# Patient Record
Sex: Male | Born: 1975 | Race: White | Hispanic: No | Marital: Married
Health system: Southern US, Community
[De-identification: ages and names within clinical notes are randomized; demographics above are authoritative.]

---

## 2000-03-05 ENCOUNTER — Emergency Department (HOSPITAL_COMMUNITY): Admission: EM | Admit: 2000-03-05 | Discharge: 2000-03-05 | Payer: Self-pay | Admitting: Emergency Medicine

## 2010-06-21 ENCOUNTER — Other Ambulatory Visit: Payer: Self-pay | Admitting: Pulmonary Disease

## 2010-06-21 ENCOUNTER — Ambulatory Visit
Admission: RE | Admit: 2010-06-21 | Discharge: 2010-06-21 | Disposition: A | Discharge status: Home / Self Care | Payer: Managed Care, Other (non HMO) | Source: Ambulatory Visit | Attending: Pulmonary Disease | Admitting: Pulmonary Disease

## 2010-06-21 DIAGNOSIS — R9389 Abnormal findings on diagnostic imaging of other specified body structures: Secondary | ICD-10-CM

## 2010-06-21 DIAGNOSIS — J189 Pneumonia, unspecified organism: Secondary | ICD-10-CM

## 2010-06-21 MED ORDER — IOHEXOL 300 MG/ML  SOLN
75.0000 mL | Freq: Once | INTRAMUSCULAR | Status: AC | PRN
  Administered 2010-06-21: 75 mL via INTRAVENOUS

## 2010-08-22 ENCOUNTER — Telehealth: Payer: Self-pay | Admitting: *Deleted

## 2010-08-22 DIAGNOSIS — E785 Hyperlipidemia, unspecified: Secondary | ICD-10-CM

## 2010-08-22 NOTE — Telephone Encounter (Signed)
Per Dr Gala Romney pt needs fasting labs, cmet, lipids, cbc, crp, hgb a1c he will come in on Fri 8/10

## 2010-08-24 ENCOUNTER — Other Ambulatory Visit (INDEPENDENT_AMBULATORY_CARE_PROVIDER_SITE_OTHER): Payer: Managed Care, Other (non HMO) | Admitting: *Deleted

## 2010-08-24 DIAGNOSIS — E785 Hyperlipidemia, unspecified: Secondary | ICD-10-CM

## 2010-08-24 LAB — CBC WITH DIFFERENTIAL/PLATELET
Basophils Relative: 0.5 % (ref 0.0–3.0)
Eosinophils Relative: 1.3 % (ref 0.0–5.0)
Lymphocytes Relative: 35.5 % (ref 12.0–46.0)
MCV: 94.2 fl (ref 78.0–100.0)
Monocytes Relative: 7.7 % (ref 3.0–12.0)
Neutrophils Relative %: 55 % (ref 43.0–77.0)
RBC: 4.8 Mil/uL (ref 4.22–5.81)
WBC: 4.7 10*3/uL (ref 4.5–10.5)

## 2010-08-24 LAB — HEPATIC FUNCTION PANEL
ALT: 23 U/L (ref 0–53)
AST: 24 U/L (ref 0–37)
Alkaline Phosphatase: 114 U/L (ref 39–117)
Bilirubin, Direct: 0 mg/dL (ref 0.0–0.3)
Total Protein: 8.2 g/dL (ref 6.0–8.3)

## 2010-08-24 LAB — BASIC METABOLIC PANEL
BUN: 15 mg/dL (ref 6–23)
CO2: 30 mEq/L (ref 19–32)
GFR: 94.93 mL/min (ref >60.00)
Glucose, Bld: 89 mg/dL (ref 70–99)
Potassium: 4.5 mEq/L (ref 3.5–5.1)

## 2010-08-24 LAB — HEMOGLOBIN A1C: Hgb A1c MFr Bld: 5.7 % (ref 4.6–6.5)

## 2010-08-24 LAB — LIPID PANEL
LDL Cholesterol: 138 mg/dL — ABNORMAL HIGH (ref 0–99)
Total CHOL/HDL Ratio: 5

## 2010-09-19 ENCOUNTER — Telehealth (HOSPITAL_COMMUNITY): Payer: Self-pay | Admitting: *Deleted

## 2010-09-19 DIAGNOSIS — E785 Hyperlipidemia, unspecified: Secondary | ICD-10-CM

## 2010-09-19 NOTE — Telephone Encounter (Signed)
gxt ordered 

## 2010-09-19 NOTE — Telephone Encounter (Signed)
Message copied by Noralee Space on Wed Sep 19, 2010 10:11 AM ------      Message from: Arvilla Meres R      Created: Mon Aug 27, 2010  9:37 PM       Cholesterol and CRP high. I emailed him and told him he needs diet and exercise. Please arrange for ETT .

## 2010-10-09 ENCOUNTER — Encounter: Payer: Managed Care, Other (non HMO) | Admitting: Internal Medicine

## 2010-10-23 ENCOUNTER — Ambulatory Visit (INDEPENDENT_AMBULATORY_CARE_PROVIDER_SITE_OTHER): Payer: Managed Care, Other (non HMO) | Admitting: Internal Medicine

## 2010-10-23 DIAGNOSIS — E785 Hyperlipidemia, unspecified: Secondary | ICD-10-CM

## 2010-10-23 NOTE — Progress Notes (Signed)
Exercise Treadmill Test  Pre-Exercise Testing Evaluation Rhythm: normal sinus  Rate: 77   PR:  15 QRS:  .09  QT:  .38 QTc: .43     Test  Exercise Tolerance Test Ordering MD: Arvilla Meres, MD  Interpreting MD:  Arvilla Meres, MD  Unique Test No: 1  Treadmill:  1  Indication for ETT: known ASHD  Contraindication to ETT: No   Stress Modality: exercise - treadmill  Cardiac Imaging Performed: non   Protocol: standard Bruce - maximal  Max BP:  203/52  Max MPHR (bpm):  188 85% MPR (bpm):  159   MPHR obtained (bpm):  203 Percentage if MPHR obtained: 105%  Reached 85% MPHR (min:sec):  3:00 Total exercise time: 8:00  Workload in METS:  11.7 Borg Scale: 18  Reason ETT Terminated:  fatigue    ST Segment Analysis At Rest: normal ST segments - no evidence of significant ST depression With Exercise: no evidence of significant ST depression  Other Information Arrhythmia:  No Angina during ETT:  absent (0) Quality of ETT:  diagnostic  ETT Interpretation:  normal - no evidence of ischemia by ST analysis  Comments: Stage I advanced after 1:00 so total exercise time 10:00. Hypertensive response to exercise. Brisk HR response.  Recommendations: Regular exercise training. Continue risk factor management.

## 2013-06-24 ENCOUNTER — Other Ambulatory Visit: Payer: Self-pay | Admitting: Otolaryngology

## 2013-06-24 DIAGNOSIS — R1314 Dysphagia, pharyngoesophageal phase: Secondary | ICD-10-CM

## 2013-06-28 ENCOUNTER — Ambulatory Visit
Admission: RE | Admit: 2013-06-28 | Discharge: 2013-06-28 | Disposition: A | Discharge status: Home / Self Care | Payer: Managed Care, Other (non HMO) | Source: Ambulatory Visit | Attending: Otolaryngology | Admitting: Otolaryngology

## 2013-06-28 DIAGNOSIS — R1314 Dysphagia, pharyngoesophageal phase: Secondary | ICD-10-CM

## 2013-12-01 ENCOUNTER — Encounter: Payer: Self-pay | Admitting: Internal Medicine

## 2019-01-20 ENCOUNTER — Ambulatory Visit: Payer: Managed Care, Other (non HMO) | Attending: Internal Medicine

## 2019-01-20 DIAGNOSIS — Z20822 Contact with and (suspected) exposure to covid-19: Secondary | ICD-10-CM

## 2019-01-22 LAB — NOVEL CORONAVIRUS, NAA: SARS-CoV-2, NAA: NOT DETECTED

## 2019-05-10 ENCOUNTER — Ambulatory Visit (HOSPITAL_COMMUNITY)
Admission: RE | Admit: 2019-05-10 | Discharge: 2019-05-10 | Disposition: A | Discharge status: Home / Self Care | Payer: BC Managed Care – PPO | Source: Ambulatory Visit | Attending: Internal Medicine | Admitting: Internal Medicine

## 2019-05-10 ENCOUNTER — Ambulatory Visit (HOSPITAL_BASED_OUTPATIENT_CLINIC_OR_DEPARTMENT_OTHER)
Admission: RE | Admit: 2019-05-10 | Discharge: 2019-05-10 | Disposition: A | Discharge status: Home / Self Care | Payer: BC Managed Care – PPO | Source: Ambulatory Visit | Attending: Internal Medicine | Admitting: Internal Medicine

## 2019-05-10 ENCOUNTER — Other Ambulatory Visit: Payer: Self-pay

## 2019-05-10 ENCOUNTER — Telehealth (HOSPITAL_COMMUNITY): Payer: Self-pay | Admitting: *Deleted

## 2019-05-10 VITALS — BP 148/98 | HR 60 | Ht 76.0 in | Wt 243.1 lb

## 2019-05-10 DIAGNOSIS — I479 Paroxysmal tachycardia, unspecified: Secondary | ICD-10-CM

## 2019-05-10 DIAGNOSIS — R0600 Dyspnea, unspecified: Secondary | ICD-10-CM

## 2019-05-10 DIAGNOSIS — R5383 Other fatigue: Secondary | ICD-10-CM | POA: Diagnosis not present

## 2019-05-10 DIAGNOSIS — Z8616 Personal history of COVID-19: Secondary | ICD-10-CM | POA: Diagnosis not present

## 2019-05-10 DIAGNOSIS — E782 Mixed hyperlipidemia: Secondary | ICD-10-CM | POA: Insufficient documentation

## 2019-05-10 DIAGNOSIS — R000 Tachycardia, unspecified: Secondary | ICD-10-CM | POA: Diagnosis not present

## 2019-05-10 LAB — COMPREHENSIVE METABOLIC PANEL
ALT: 23 U/L (ref 0–44)
AST: 24 U/L (ref 15–41)
Albumin: 4.4 g/dL (ref 3.5–5.0)
Alkaline Phosphatase: 148 U/L — ABNORMAL HIGH (ref 38–126)
Anion gap: 11 (ref 5–15)
BUN: 17 mg/dL (ref 6–20)
CO2: 22 mmol/L (ref 22–32)
Calcium: 9.4 mg/dL (ref 8.9–10.3)
Chloride: 105 mmol/L (ref 98–111)
Creatinine, Ser: 0.96 mg/dL (ref 0.61–1.24)
GFR calc Af Amer: >60 mL/min (ref >60)
GFR calc non Af Amer: >60 mL/min (ref >60)
Glucose, Bld: 79 mg/dL (ref 70–99)
Potassium: 4.3 mmol/L (ref 3.5–5.1)
Sodium: 138 mmol/L (ref 135–145)
Total Bilirubin: 0.8 mg/dL (ref 0.3–1.2)
Total Protein: 7.8 g/dL (ref 6.5–8.1)

## 2019-05-10 LAB — CBC
HCT: 44.3 % (ref 39.0–52.0)
Hemoglobin: 14.7 g/dL (ref 13.0–17.0)
MCH: 31.4 pg (ref 26.0–34.0)
MCHC: 33.2 g/dL (ref 30.0–36.0)
MCV: 94.7 fL (ref 80.0–100.0)
Platelets: 212 10*3/uL (ref 150–400)
RBC: 4.68 MIL/uL (ref 4.22–5.81)
RDW: 12.5 % (ref 11.5–15.5)
WBC: 5.8 10*3/uL (ref 4.0–10.5)
nRBC: 0 % (ref 0.0–0.2)

## 2019-05-10 LAB — LIPID PANEL
Cholesterol: 348 mg/dL — ABNORMAL HIGH (ref 0–200)
HDL: 33 mg/dL — ABNORMAL LOW (ref >40)
LDL Cholesterol: 300 mg/dL — ABNORMAL HIGH (ref 0–99)
Total CHOL/HDL Ratio: 10.5 RATIO
Triglycerides: 77 mg/dL (ref <150)
VLDL: 15 mg/dL (ref 0–40)

## 2019-05-10 LAB — TSH: TSH: 1.386 u[IU]/mL (ref 0.350–4.500)

## 2019-05-10 NOTE — Progress Notes (Signed)
CARDIOLOGY [facility] CLINIC  NOTE   HPI:  Edward Bauer is a 44 y/o male  With no significant PMHx. Biological father died at 49 from an "arrhythmia".  We did stress test on him in 2012 and was normal.  Had Covid in 12/20. Very fatigued, unable to climb stairs. 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  Started Keto diet in January. Has lost 55 pounds. Very low carbs. Ad 1st covid vaccine last week   Doing resistance bands 6 nights per week. HR gets up in 140-150s. No change. Went hiking yesterday at [employer]. Felt great at the beginning. Walking up the hill was very SOB and HR was in 180s on Apple Watch. Walk down was fine. No CP.   Echo today EF 60-65% normal RV. Normal diastolic function.  NSR 68 No ST-T wave abnormalities.   Mild flutter occasionally for 5-10 secs then goes away. No caffeine. + snoring     Review of Systems: [y] = yes, [ ]  = no   General: Weight gain [ ] ; Weight loss ]; Anorexia [ ] ; Fatigue ]; Fever [ ] ; Chills [ ] ; Weakness [ ]   Cardiac: Chest pain/pressure [ ] ; Resting SOB [ ] ; Exertional SOB [place] ]; Orthopnea [ ] ; Pedal Edema [ ] ; Palpitations ]; Syncope [ ] ; Presyncope [ ] ; Paroxysmal nocturnal dyspnea[ ]   Pulmonary: Cough [ ] ; Wheezing[ ] ; Hemoptysis[ ] ; Sputum [ ] ; Snoring [ ]   GI: Vomiting[ ] ; Dysphagia[ ] ; Melena[ ] ; Hematochezia [ ] ; Heartburn[ ] ; Abdominal pain [ ] ; Constipation [ ] ; Diarrhea [ ] ; BRBPR [ ]   GU: Hematuria[ ] ; Dysuria [ ] ; Nocturia[ ]   Vascular: Pain in legs with walking [ ] ; Pain in feet with lying flat [ ] ; Non-healing sores [ ] ; Stroke [ ] ; TIA [ ] ; Slurred speech [ ] ;  Neuro: Headaches[ ] ; Vertigo[ ] ; Seizures[ ] ; Paresthesias[ ] ;Blurred vision [ ] ; Diplopia [ ] ; Vision changes [ ]   Ortho/Skin: Arthritis [ ] ; Joint pain [ ] ; Muscle pain [ ] ; Joint swelling [ ] ; Back Pain [ ] ; Rash [ ]   Psych: Depression[ ] ; Anxiety[ ]   Heme: Bleeding problems [ ] ; Clotting disorders [ ] ; Anemia [ ]   Endocrine: Diabetes [ ] ; Thyroid  dysfunction[ ]    No past medical history on file.  Current Outpatient Medications  Medication Sig Dispense Refill  . ibuprofen (ADVIL) 200 MG tablet Take 200 mg by mouth every 6 (six) hours as needed.     No current facility-administered medications for this encounter.    No Known Allergies    Social History   Socioeconomic History  . Marital status: Married    Spouse name: Not on file  . Number of children: Not on file  . Years of education: Not on file  . Highest education level: Not on file  Occupational History  . Not on file  Tobacco Use  . Smoking status: Not on file  Substance and Sexual Activity  . Alcohol use: Not on file  . Drug use: Not on file  . Sexual activity: Not on file  Other Topics Concern  . Not on file  Social History Narrative  . Not on file   Social Determinants of Health   Financial Resource Strain:   . Difficulty of Paying Living Expenses:   Food Insecurity:   . Worried About [place] in the Last Year:   . in the Last Year:   Transportation Needs:   .  Lack of Transportation (Medical):   Marland Kitchen Lack of Transportation (Non-Medical):   Physical Activity:   . Days of Exercise per Week:   . Minutes of Exercise per Session:   Stress:   . Feeling of Stress :   Social Connections:   . Frequency of Communication with Friends and Family:   . Frequency of Social Gatherings with Friends and Family:   . Attends Religious Services:   . Active Member of Clubs or Organizations:   . Attends Archivist Meetings:   Marland Kitchen Marital Status:   Intimate Partner Violence:   . Fear of Current or Ex-Partner:   . Emotionally Abused:   Marland Kitchen Physically Abused:   . Sexually Abused:      No family history on file.  Vitals:   05/10/19 1515  BP: (!) 148/98  Pulse: 60  SpO2: 98%  Weight: 110.3 kg (243 lb 2 oz)  Height: 6\' 4"  (1.93 m)    PHYSICAL EXAM: General:  Well appearing. No respiratory difficulty HEENT: normal Neck:  supple. no JVD. Carotids 2+ bilat; no bruits. No lymphadenopathy or thryomegaly appreciated. Cor: PMI nondisplaced. Regular rate & rhythm. No rubs, gallops or murmurs. Lungs: clear Abdomen: soft, nontender, nondistended. No hepatosplenomegaly. No bruits or masses. Good bowel sounds. Extremities: no cyanosis, clubbing, rash, edema Neuro: alert & oriented x 3, cranial nerves grossly intact. moves all 4 extremities w/o difficulty. Affect pleasant.  ECG: NSR 66 No ST-T wave abnormalities.  Personally reviewed   ASSESSMENT & PLAN:  1. Dyspnea and tachycardia - echo and ECG normal Personally reviewed - suspect related to low-carb diet and glycogen depletion - have instructed him to add back at least 80-100g carbs per day into his diet and do more aerobic exercise. If symptoms persist can proceed with coronary CT  2. HL - TC 300. Markedly elevated from before - likely related to keto diet - discussed reducing saturate fats and eating more balanced diet - start crestor - given Fhx of early CV disease will get coronary calcium score  Glori Bickers, MD  10:52 AM

## 2019-05-10 NOTE — Telephone Encounter (Signed)
Per Dr Gala Romney pt has been having tachycardia and sob, he needs asap echo and ekg. Order placed, will arrange

## 2019-05-10 NOTE — Progress Notes (Signed)
  Echocardiogram 2D Echocardiogram has been performed.  Edward Bauer 05/10/2019, 2:46 PM

## 2019-05-10 NOTE — Patient Instructions (Signed)
Labs done today, your results will be available in MyChart, we will contact you for abnormal readings.

## 2019-05-12 ENCOUNTER — Other Ambulatory Visit (HOSPITAL_COMMUNITY): Payer: Self-pay | Admitting: *Deleted

## 2019-05-14 ENCOUNTER — Telehealth (HOSPITAL_COMMUNITY): Payer: Self-pay | Admitting: *Deleted

## 2019-05-14 DIAGNOSIS — R0602 Shortness of breath: Secondary | ICD-10-CM

## 2019-05-14 MED ORDER — ROSUVASTATIN CALCIUM 40 MG PO TABS
40.0000 mg | ORAL_TABLET | Freq: Every day | ORAL | 6 refills | Status: AC
Start: 2019-05-14 — End: ?

## 2019-05-14 NOTE — Addendum Note (Signed)
Addended by: Modesta Messing on: 05/14/2019 03:56 PM   Modules accepted: Orders

## 2019-05-14 NOTE — Telephone Encounter (Signed)
Edward Bauer, CMA  05/14/2019 3:21 PM EDT    Pt called back he is aware of results. Crestor sent to pharmacy. Order placed for CT and sent to [facility] to schedule.    Schub, Heather M, RN  05/11/2019 4:37 PM EDT    Left message to call back    Bensimhon, Daniel R, MD  05/10/2019 7:45 PM EDT    Start crestor 40. Get coronary calcium CT. I tiold him to get off his current high protein/meat diet     

## 2019-05-14 NOTE — Telephone Encounter (Signed)
-----   Message from Noralee Space, RN sent at 05/11/2019  4:37 PM EDT ----- Left message to call back

## 2019-05-19 ENCOUNTER — Telehealth (HOSPITAL_COMMUNITY): Payer: Self-pay | Admitting: *Deleted

## 2019-05-19 NOTE — Telephone Encounter (Signed)
-----   Message from Dolores Patty, MD sent at 05/10/2019  7:45 PM EDT ----- Start crestor 40. Get coronary calcium CT. I tiold him to get off his current high protein/meat diet

## 2019-05-19 NOTE — Telephone Encounter (Signed)
Modesta Messing, [place]  05/14/2019 3:21 PM EDT    Pt called back he is aware of results. Crestor sent to pharmacy. Order placed for CT and sent to [facility] to schedule.    Noralee Space, RN  05/11/2019 4:37 PM EDT    Left message to call back    Bensimhon, Bevelyn Buckles, MD  05/10/2019 7:45 PM EDT    Start crestor 40. Get coronary calcium CT. I tiold him to get off his current high protein/meat diet

## 2019-05-25 ENCOUNTER — Other Ambulatory Visit: Payer: Self-pay

## 2019-05-25 ENCOUNTER — Ambulatory Visit (INDEPENDENT_AMBULATORY_CARE_PROVIDER_SITE_OTHER)
Admission: RE | Admit: 2019-05-25 | Discharge: 2019-05-25 | Disposition: A | Discharge status: Home / Self Care | Payer: Self-pay | Source: Ambulatory Visit | Attending: Internal Medicine | Admitting: Internal Medicine

## 2019-05-25 DIAGNOSIS — R0602 Shortness of breath: Secondary | ICD-10-CM

## 2020-06-23 IMAGING — CT CT CARDIAC CORONARY ARTERY CALCIUM SCORE
3 series · 14 of 20 positions shown, 15 images · non-contrast
Comparison: 06/21/2010
COMPARISON: 06/21/2010

Addendum:
EXAM:
OVER-READ INTERPRETATION  CT CHEST

The following report is an over-read performed by radiologist Dr.
Aydan Pant [REDACTED] on 05/25/2019. This over-read
does not include interpretation of cardiac or coronary anatomy or
pathology. The coronary calcium score interpretation by the
cardiologist is attached.
CLINICAL DATA: Risk stratification
Coronary Calcium Score
TECHNIQUE: The patient was scanned on a Siemens Force scanner. Axial
non-contrast 3 mm slices were carried out through the heart. The
data set was analyzed on a dedicated work station and scored using
the Agatson method.

[Series 2: casc 3.0 bv41 2 bestdiast 69 % · axial · 0.37mm/px · z∈[-246,-152]mm · 4 of 53 slices shown, 5 images]
[im 11/53  vessel]
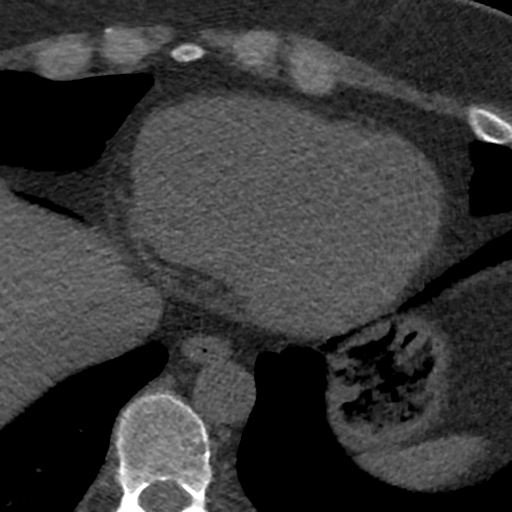
[im 11/53  lung]
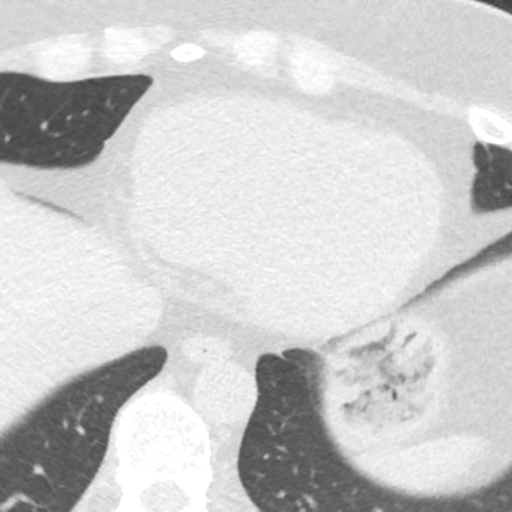
[im 21/53  vessel]
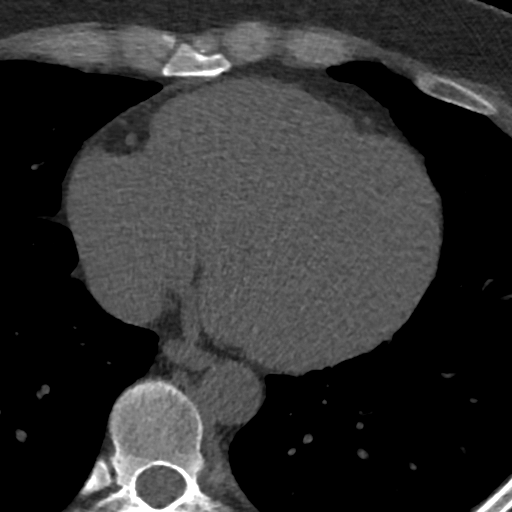
[im 32/53  vessel]
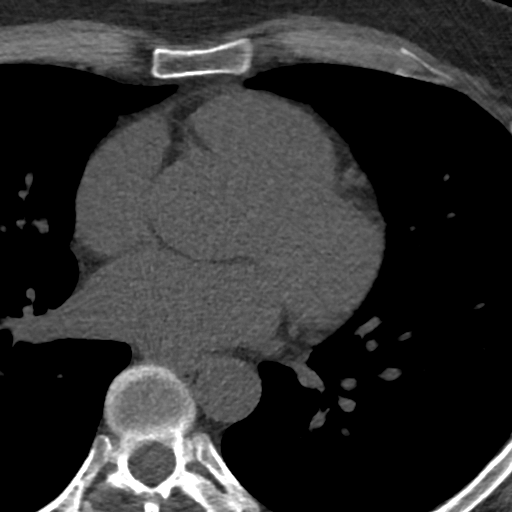
[im 42/53  vessel]
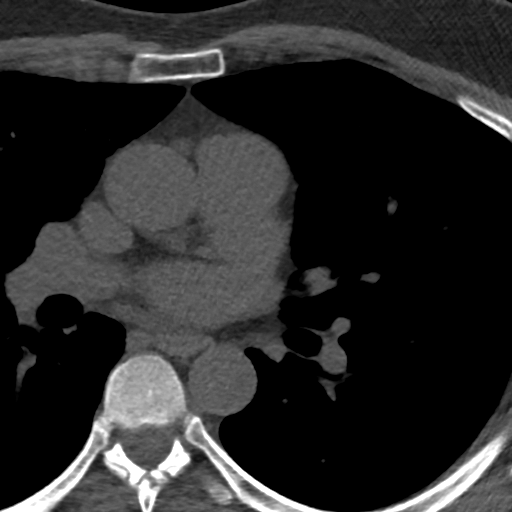

[Series 3: lung 69 % · axial · 0.79mm/px · z∈[-252,-146]mm · 5 of 53 slices shown]
[im 9/53  lung]
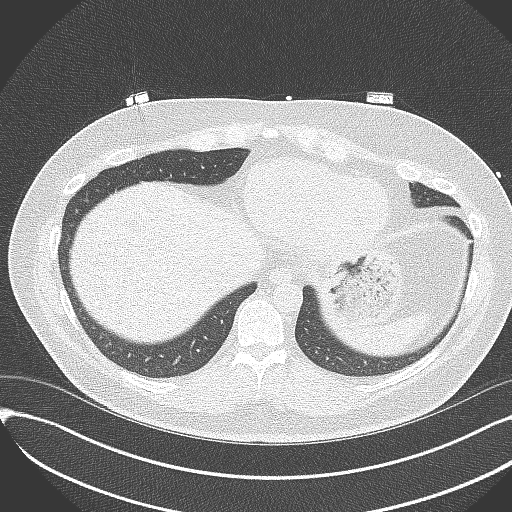
[im 18/53  lung]
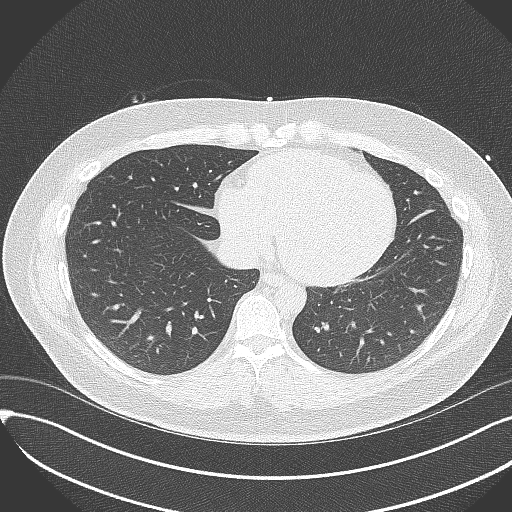
[im 27/53  lung]
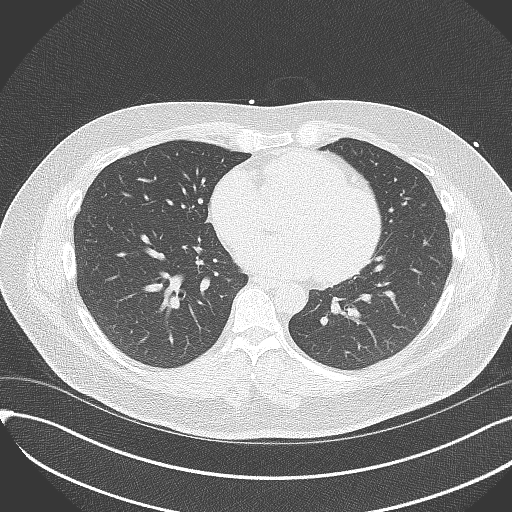
[im 35/53  lung]
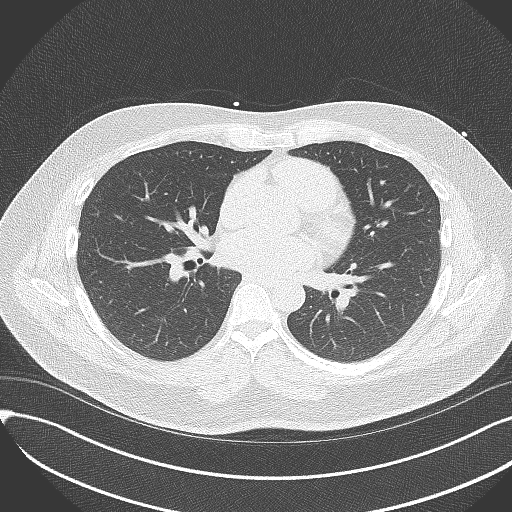
[im 44/53  lung]
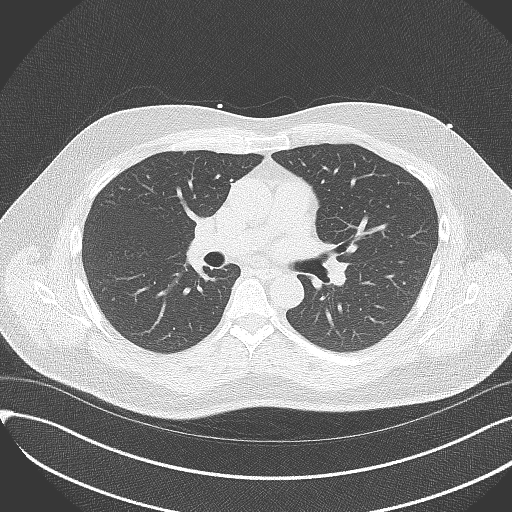

[Series 4: lung st 69 % · axial · 0.79mm/px · z∈[-252,-146]mm · 5 of 53 slices shown]
[im 9/53  lung]
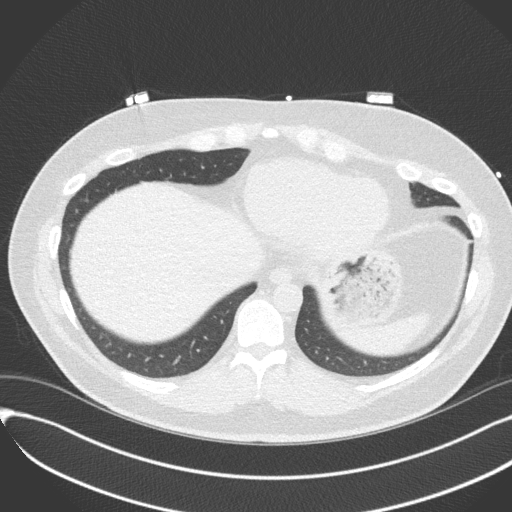
[im 18/53  lung]
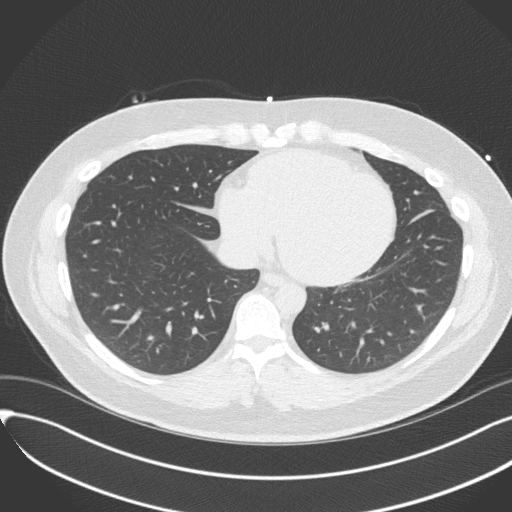
[im 27/53  lung]
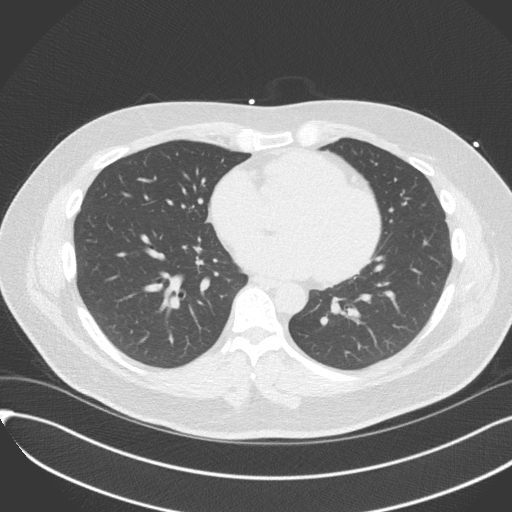
[im 35/53  lung]
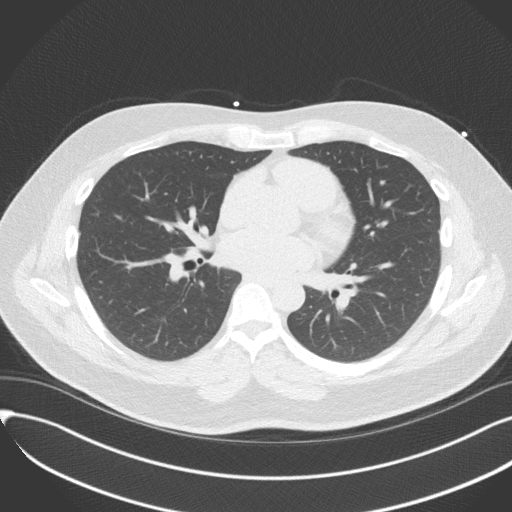
[im 44/53  lung]
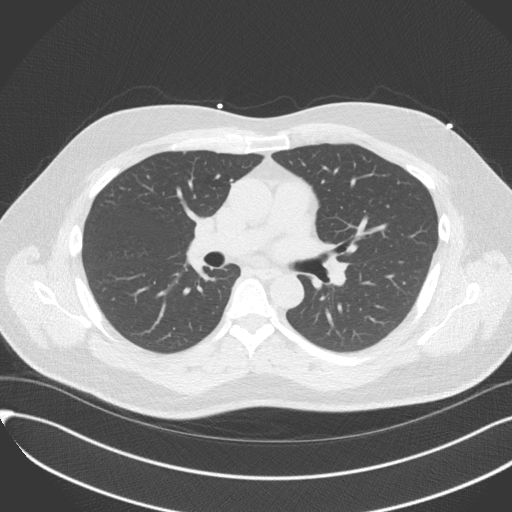

[14 of 20 positions shown; findings below may reference images not displayed]

FINDINGS: Vascular: Heart is normal size.  Aorta normal caliber.

Mediastinum/Nodes: No adenopathy

Lungs/Pleura: Visualized lungs clear. No effusions. Small nodule at
the left lung base peripherally on image 44 is stable since 9329.

Upper Abdomen: Imaging into the upper abdomen shows no acute
findings.

Musculoskeletal: Chest wall soft tissues are unremarkable. No acute
bony abnormality.
IMPRESSION: No acute or significant extracardiac abnormality.
FINDINGS: Non-cardiac: See separate report from [REDACTED].

Ascending Aorta: Normal size

Pericardium: Normal thickness

Coronary arteries: Calcium score 0
IMPRESSION: Coronary calcium score of 0.

*** End of Addendum ***
EXAM:
OVER-READ INTERPRETATION  CT CHEST

The following report is an over-read performed by radiologist Dr.
Aydan Pant [REDACTED] on 05/25/2019. This over-read
does not include interpretation of cardiac or coronary anatomy or
pathology. The coronary calcium score interpretation by the
cardiologist is attached.
FINDINGS: Vascular: Heart is normal size.  Aorta normal caliber.

Mediastinum/Nodes: No adenopathy

Lungs/Pleura: Visualized lungs clear. No effusions. Small nodule at
the left lung base peripherally on image 44 is stable since 9329.

Upper Abdomen: Imaging into the upper abdomen shows no acute
findings.

Musculoskeletal: Chest wall soft tissues are unremarkable. No acute
bony abnormality.
IMPRESSION: No acute or significant extracardiac abnormality.

## 2023-10-29 ENCOUNTER — Encounter (HOSPITAL_COMMUNITY): Payer: Self-pay

## 2023-10-29 ENCOUNTER — Emergency Department (HOSPITAL_COMMUNITY)
Admission: EM | Admit: 2023-10-29 | Discharge: 2023-10-29 | Disposition: A | Discharge status: Home / Self Care | Attending: Emergency Medicine | Admitting: Emergency Medicine

## 2023-10-29 DIAGNOSIS — S0181XA Laceration without foreign body of other part of head, initial encounter: Secondary | ICD-10-CM | POA: Diagnosis present

## 2023-10-29 DIAGNOSIS — W1809XA Striking against other object with subsequent fall, initial encounter: Secondary | ICD-10-CM | POA: Diagnosis not present

## 2023-10-29 NOTE — Discharge Instructions (Signed)
 Follow up with your doctor as needed. Return to the ED with any new concerns.

## 2023-10-29 NOTE — ED Triage Notes (Signed)
 Patient in ED today with a laceration on his right chin. He states he tripped and didn't brace his fall. He hit his face on rocks. No bleeding on arrival, only complains of a headache. No thinners and denies LOC.

## 2023-10-29 NOTE — ED Triage Notes (Signed)
 Pt was turning off hose and tripped and fell, hit jaw on rocks. Laceration noted to chin. Last tetanus shot was November 2024

## 2023-10-29 NOTE — ED Provider Notes (Signed)
 [place] EMERGENCY DEPARTMENT AT [facility] Provider Note   CSN: [id number] Arrival date & time: 10/29/23  1242     Patient presents with: Laceration   Edward Bauer is a 48 y.o. male.   Patient with mechanical fall earlier today with laceration to chin after hitting rock. No LOC, no dental pain. Denies neck pain. No other injury. Tetanus is UTD.  The history is provided by the patient. No language interpreter was used.  Laceration      Prior to Admission medications   Medication Sig Start Date End Date Taking? Authorizing Provider  ibuprofen (ADVIL) 200 MG tablet Take 200 mg by mouth every 6 (six) hours as needed.    [provider]  rosuvastatin  (CRESTOR ) 40 MG tablet Take 1 tablet (40 mg total) by mouth daily. 05/14/19   Bensimhon, Toribio SAUNDERS, MD    Allergies: Patient has no known allergies.    Review of Systems  Updated Vital Signs BP (!) 161/97 (BP Location: Right Arm)   Pulse 66   Temp 98.3 F (36.8 C)   Resp 18   SpO2 100%   Physical Exam Vitals and nursing note reviewed.  Constitutional:      Appearance: He is well-developed.  Pulmonary:     Effort: Pulmonary effort is normal.  Musculoskeletal:        General: Normal range of motion.     Cervical back: Normal range of motion.     Comments: No mandibular tenderness or malocclusion. He is chewing ice without limitation or pain. No midline cervical tenderness.   Skin:    General: Skin is warm and dry.     Comments: 3 cm laceration to right mandibular face. Only 1.5 cm of the lac is full thickness. No immediate swelling.   Neurological:     Mental Status: He is alert and oriented to person, place, and time.     (all labs ordered are listed, but only abnormal results are displayed) Labs Reviewed - No data to display  EKG: None  Radiology: No results found.   .Laceration Repair  Date/Time: 10/29/2023 2:35 PM  Performed by: Odell Balls, PA-C Authorized by: Odell Balls, PA-C   Consent:    Consent obtained:  Verbal Universal protocol:    Procedure explained and questions answered to patient or proxy's satisfaction: yes     Patient identity confirmed:  Verbally with patient Laceration details:    Location:  Face   Face location:  R cheek   Length (cm):  1.5 Treatment:    Area cleansed with:  Saline   Amount of cleaning:  Standard Skin repair:    Repair method:  Tissue adhesive Approximation:    Approximation:  Close Post-procedure details:    Dressing:  Open (no dressing)    Medications Ordered in the ED - No data to display  Clinical Course as of 10/29/23 1437  Wed Oct 29, 2023  1435 Patient with facial laceration from mechanical fall. No underlying injury suspected. Wound closed after cleansing with dermabond with good closure.  [SU]    Clinical Course User Index [SU] Odell Balls, PA-C                                 Medical Decision Making       Final diagnoses:  Facial laceration, initial encounter    ED Discharge Orders     None  Odell Balls, PA-C 10/29/23 1437    Dean Clarity, MD 10/29/23 [phone]
